# Patient Record
Sex: Female | Born: 2000 | Race: White | Hispanic: No | Marital: Single | Smoking: Never smoker
Health system: Southern US, Community
[De-identification: ages and names within clinical notes are randomized; demographics above are authoritative.]

## PROBLEM LIST (undated history)

## (undated) DIAGNOSIS — F419 Anxiety disorder, unspecified: Secondary | ICD-10-CM

## (undated) HISTORY — PX: WISDOM TOOTH EXTRACTION: SHX21

## (undated) HISTORY — PX: MOUTH SURGERY: SHX715

## (undated) HISTORY — DX: Anxiety disorder, unspecified: F41.9

---
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
## 2020-01-04 ENCOUNTER — Ambulatory Visit: Payer: BC Managed Care – PPO | Admitting: Dermatology

## 2020-02-26 ENCOUNTER — Ambulatory Visit: Payer: BC Managed Care – PPO | Admitting: Dermatology

## 2020-03-20 ENCOUNTER — Ambulatory Visit: Payer: BC Managed Care – PPO | Admitting: Dermatology

## 2020-03-20 ENCOUNTER — Other Ambulatory Visit: Payer: Self-pay

## 2020-03-20 DIAGNOSIS — L700 Acne vulgaris: Secondary | ICD-10-CM

## 2020-03-20 MED ORDER — DOXYCYCLINE HYCLATE 20 MG PO TABS: 20.0000 mg | ORAL_TABLET | Freq: Two times a day (BID) | ORAL | 0 refills | Status: AC

## 2020-03-20 MED ORDER — SPIRONOLACTONE 50 MG PO TABS: 75.0000 mg | ORAL_TABLET | Freq: Every day | ORAL | 2 refills | Status: DC

## 2020-03-20 NOTE — Progress Notes (Signed)
   Follow-Up Visit   Subjective  Morgan Simmons is a 19 y.o. female who presents for the following: Acne follow-up. She reports some flaring especially around menses.  The following portions of the chart were reviewed this encounter and updated as appropriate:  Tobacco  Allergies  Meds  Problems  Med Hx  Surg Hx  Fam Hx      Review of Systems: No other skin or systemic complaints except as noted in HPI or Assessment and Plan.   Objective  Well appearing patient in no apparent distress; mood and affect are within normal limits.  A focused examination was performed including face, neck, chest and back. Relevant physical exam findings are noted in the Assessment and Plan.  Objective  Head - Anterior (Face): Scattered erythematous papules Closed comedones at the cheeks Scattered inflammatory papules chest and back and neck  Assessment & Plan  Acne vulgaris Head - Anterior (Face)  Chronic condition with expected duration over one year. There is no cure, only control. Condition is bothersome to patient. Not currently at goal.  Increase Spironolactone to 75 mg QD. (1.5 tables daily). Decrease to 50 mg if you have any lightheadedness.  She did not previously tolerate 100 mg qhs.  Continue Tretinoin 0.025% cream QHS. Continue Clindamycin lotion QAM.    BP in office 131/83  Spironolactone can cause increased urination and cause blood pressure to decrease. Please watch for signs of lightheadedness and be cautious when changing position. It can sometimes cause breast tenderness or an irregular period in premenopausal women. It can also increase potassium. The increase in potassium usually is not a concern unless you are taking other medicines that also increase potassium, so please be sure your doctor knows all of the other medications you are taking. This medication should not be taken by pregnant women.  This medicine should also not be taken together with sulfa drugs like  Bactrim (trimethoprim/sulfamethexazole).   Take Doxycycline 20 MG BID. Take with food.  Doxycycline should be taken with food to prevent nausea. Do not lay down for 30 minutes after taking. Be cautious with sun exposure and use good sun protection while on this medication. Pregnant women should not take this medication.     doxycycline (PERIOSTAT) 20 MG tablet - Head - Anterior (Face)  spironolactone (ALDACTONE) 50 MG tablet - Head - Anterior (Face)  Other Related Medications clindamycin (CLEOCIN-T) 1 % lotion tretinoin (RETIN-A) 0.025 % cream Dapsone (ACZONE) 7.5 % GEL spironolactone (ALDACTONE) 50 MG tablet  Return in about 6 weeks (around 05/01/2020) for acne f/u.   I, Epifania Gore, CMA, scribed for Sandi Mealy, MD.  Documentation: I have reviewed the above documentation for accuracy and completeness, and I agree with the above.  Darden Dates, MD

## 2020-03-20 NOTE — Patient Instructions (Addendum)
Consider adding Viviscal supplement for hair growth if pediatrician does not have any concerns about interactions with Zoloft  Continue tretinoin at night Continue clindamycin in the morning  Increase spironolactone to 1.5 tablets each day (75 mg total). Decrease dose if you have any lightheadedness.  Start doxycycline 20 mg tablet twice a day with food.   Doxycycline should be taken with food to prevent nausea. Do not lay down for 30 minutes after taking. Be cautious with sun exposure and use good sun protection while on this medication. Pregnant women should not take this medication.   Spironolactone can cause increased urination and cause blood pressure to decrease. Please watch for signs of lightheadedness and be cautious when changing position. It can sometimes cause breast tenderness or an irregular period in premenopausal women. It can also increase potassium. The increase in potassium usually is not a concern unless you are taking other medicines that also increase potassium, so please be sure your doctor knows all of the other medications you are taking. This medication should not be taken by pregnant women.  This medicine should also not be taken together with sulfa drugs like Bactrim (trimethoprim/sulfamethexazole).

## 2020-03-28 ENCOUNTER — Encounter: Payer: Self-pay | Admitting: Dermatology

## 2020-04-10 ENCOUNTER — Other Ambulatory Visit: Payer: Self-pay

## 2020-04-10 ENCOUNTER — Ambulatory Visit: Payer: BC Managed Care – PPO | Admitting: Dermatology

## 2020-04-10 ENCOUNTER — Encounter: Payer: Self-pay | Admitting: Dermatology

## 2020-04-10 DIAGNOSIS — L700 Acne vulgaris: Secondary | ICD-10-CM

## 2020-04-10 MED ORDER — TRETINOIN 0.025 % EX CREA: TOPICAL_CREAM | Freq: Every evening | CUTANEOUS | 11 refills | Status: AC

## 2020-04-10 MED ORDER — DOXYCYCLINE MONOHYDRATE 100 MG PO CAPS: 100.0000 mg | ORAL_CAPSULE | Freq: Two times a day (BID) | ORAL | 2 refills | Status: DC

## 2020-04-10 NOTE — Progress Notes (Signed)
   Follow-Up Visit   Subjective  Morgan Simmons. Morgan Simmons is a 20 y.o. female who presents for the following: Acne (Patient is here today for 3 week follow up on acne. Patient states she is having new breakouts on cheek. ).  She is currently taking Spironolactone 75 mg, tretinoin 0.025 % cream, clindamycin lotion 1% and doxycycline 20 mg. She reports no other concerns at this time.   The following portions of the chart were reviewed this encounter and updated as appropriate:  Tobacco  Allergies  Meds  Problems  Med Hx  Surg Hx  Fam Hx      Objective  Well appearing patient in no apparent distress; mood and affect are within normal limits.  A focused examination was performed including face, neck, chest and back. Relevant physical exam findings are noted in the Assessment and Plan.  Objective  cheeks, chin, chest, back, face: Rare inflammatory papules at chest Trace open comedones and many small inflammatory papules at face, concentrated at right cheek Scattered inflammatory papules at back and neck  Assessment & Plan  Acne vulgaris cheeks, chin, chest, back, face  Chronic condition with duration over one year. Condition is bothersome to patient. Currently flared despite treatment.  Acne with possible rosacea   BP 118/63  Continue spironolactone 75 mg daily (she did not tolerate higher dose due to decrease BP). D/c doxycycline 20 mg. Take  Doxycycline (monodox) 100 MG twice a day. Take with food.  Continue clindamycin solution each morning Continue tretinoin 0.025% cream nightly  If not responding well may consider isotretinoin. If responds well but cannot come off of doxycycline without flare, will consider topical minoxidil foam vs other rosacea topicals (metronidazole cream vs skin medicinals metronidazole/ivermectin/azelaic acid cream).   Spironolactone can cause increased urination and cause blood pressure to decrease. Please watch for signs of lightheadedness and be  cautious when changing position. It can sometimes cause breast tenderness or an irregular period in premenopausal women. It can also increase potassium. The increase in potassium usually is not a concern unless you are taking other medicines that also increase potassium, so please be sure your doctor knows all of the other medications you are taking. This medication should not be taken by pregnant women.  This medicine should also not be taken together with sulfa drugs like Bactrim (trimethoprim/sulfamethexazole).   Doxycycline should be taken with food to prevent nausea. Do not lay down for 30 minutes after taking. Be cautious with sun exposure and use good sun protection while on this medication. Pregnant women should not take this medication.     Ordered Medications: doxycycline (MONODOX) 100 MG capsule  Reordered Medications tretinoin (RETIN-A) 0.025 % cream  Other Related Medications clindamycin (CLEOCIN-T) 1 % lotion Dapsone (ACZONE) 7.5 % GEL spironolactone (ALDACTONE) 50 MG tablet doxycycline (PERIOSTAT) 20 MG tablet spironolactone (ALDACTONE) 50 MG tablet  Return in about 2 months (around 06/08/2020) for virtual on acne.  I, Asher Muir, CMA, am acting as scribe for Darden Dates, MD.  Documentation: I have reviewed the above documentation for accuracy and completeness, and I agree with the above.  Darden Dates, MD

## 2020-04-10 NOTE — Patient Instructions (Addendum)
Spironolactone can cause increased urination and cause blood pressure to decrease. Please watch for signs of lightheadedness and be cautious when changing position. It can sometimes cause breast tenderness or an irregular period in premenopausal women. It can also increase potassium. The increase in potassium usually is not a concern unless you are taking other medicines that also increase potassium, so please be sure your doctor knows all of the other medications you are taking. This medication should not be taken by pregnant women.  This medicine should also not be taken together with sulfa drugs like Bactrim (trimethoprim/sulfamethexazole).   Take Doxycycline 100 MG BID. Take with food.  Doxycycline should be taken with food to prevent nausea. Do not lay down for 30 minutes after taking. Be cautious with sun exposure and use good sun protection while on this medication. Pregnant women should not take this medication.

## 2020-06-05 ENCOUNTER — Telehealth: Payer: BC Managed Care – PPO | Admitting: Dermatology

## 2020-06-05 ENCOUNTER — Other Ambulatory Visit: Payer: Self-pay

## 2020-06-14 ENCOUNTER — Other Ambulatory Visit: Payer: Self-pay | Admitting: Dermatology

## 2020-06-14 DIAGNOSIS — L700 Acne vulgaris: Secondary | ICD-10-CM

## 2020-06-28 ENCOUNTER — Other Ambulatory Visit: Payer: Self-pay | Admitting: Dermatology

## 2020-06-28 DIAGNOSIS — L700 Acne vulgaris: Secondary | ICD-10-CM

## 2020-07-31 ENCOUNTER — Other Ambulatory Visit: Payer: Self-pay | Admitting: Dermatology

## 2020-07-31 ENCOUNTER — Encounter: Payer: Self-pay | Admitting: Dermatology

## 2020-07-31 ENCOUNTER — Telehealth (INDEPENDENT_AMBULATORY_CARE_PROVIDER_SITE_OTHER): Payer: BC Managed Care – PPO | Admitting: Dermatology

## 2020-07-31 DIAGNOSIS — L700 Acne vulgaris: Secondary | ICD-10-CM | POA: Diagnosis not present

## 2020-07-31 DIAGNOSIS — L905 Scar conditions and fibrosis of skin: Secondary | ICD-10-CM | POA: Diagnosis not present

## 2020-07-31 MED ORDER — SPIRONOLACTONE 25 MG PO TABS: ORAL_TABLET | ORAL | 1 refills | Status: DC

## 2020-07-31 MED ORDER — GENTAMICIN SULFATE 0.1 % EX CREA: 1.0000 "application " | TOPICAL_CREAM | Freq: Two times a day (BID) | CUTANEOUS | 1 refills | Status: AC

## 2020-07-31 MED ORDER — AZITHROMYCIN 500 MG PO TABS: 500.0000 mg | ORAL_TABLET | ORAL | 0 refills | Status: AC

## 2020-07-31 NOTE — Progress Notes (Signed)
Follow-Up Visit   Subjective  Morgan Simmons is a 20 y.o. female who presents for the following: Acne (Patient presents for video visit to follow up on acne. Patient is in [place]. She is currently taking spironolactone 75mg  daily and doxycycline 100mg  BID, using clindamycin QAM and tretinoin 0.025% QHS. Patient advises she is tolerating medications well but cheeks are flared. ).  Virtual Visit via Video Note  I connected with . [place] on 07/31/20 at  4:30 PM EDT by a video enabled telemedicine application and verified that I am speaking with the correct person using two identifiers.  Location: Patient: Morgan Simmons Provider: [facility]  I discussed the limitations of evaluation and management by telemedicine and the availability of in person appointments. The patient expressed understanding and agreed to proceed.   I discussed the assessment and treatment plan with the patient. The patient was provided an opportunity to ask questions and all were answered. The patient agreed with the plan and demonstrated an understanding of the instructions.   The patient was advised to call back or seek an in-person evaluation if the symptoms worsen or if the condition fails to improve as anticipated.    The following portions of the chart were reviewed this encounter and updated as appropriate:   Tobacco  Allergies  Meds  Problems  Med Hx  Surg Hx  Fam Hx      Review of Systems:  No other skin or systemic complaints except as noted in HPI or Assessment and Plan.  Objective  Well appearing patient in no apparent distress; mood and affect are within normal limits.  A focused examination was performed including face. Relevant physical exam findings are noted in the Assessment and Plan.  Objective  Head - Anterior (Face): Many inflammatory papules and pustules, 1+ open comedone, scattered scars (exam limited by video quality)   Assessment & Plan  Acne  vulgaris Head - Anterior (Face)  Chronic condition with duration over one year. Condition is bothersome to patient. Currently flared.  Severe with new scarring  Unable to culture due to video visit  D/C doxycycline Continue clindamycin QAM Continue tretinoin 0.025% QHS Continue spironolactone 75mg  1 PO QD Start gentamicin BID Start azithromycin 500mg  3 times weekly  Plan isotretinoin start.   Will mail patient iPledge paperwork and lab order for urine pregnancy . Once we receive signed paperwork we will call patient to let her know to go and have urine pregnancy done so we can get her registered. Patient is on birth control and will use female condoms for secondary.   Reviewed potential side effects of isotretinoin including xerosis, cheilitis, hepatitis, hyperlipidemia, and severe birth defects if taken by a pregnant woman. Reviewed reports of suicidal ideation in those with a history of depression while taking isotretinoin and reports of diagnosis of inflammatory bowl disease while taking isotretinoin as well as the lack of evidence for a causal relationship between isotretinoin, depression and IBD. Patient advised to reach out with any questions or concerns. Patient advised not to share pills or donate blood while on treatment or for one month after completing treatment.   Other Related Procedures Pregnancy, urine  Ordered Medications: azithromycin (ZITHROMAX) 500 MG tablet gentamicin cream (GARAMYCIN) 0.1 % spironolactone (ALDACTONE) 25 MG tablet  Other Related Medications clindamycin (CLEOCIN-T) 1 % lotion tretinoin (RETIN-A) 0.025 % cream  32 minutes spent in care of this patient including face to face video visit and coordination of care  [place] address [address] [facility] [id number] [place] [address]  Return for will schedule once iPledge paperwork received .  Anise Salvo, RMA, am acting as scribe for Darden Dates, MD .  Documentation: I have  reviewed the above documentation for accuracy and completeness, and I agree with the above.  Darden Dates, MD

## 2020-07-31 NOTE — Patient Instructions (Signed)
Reviewed potential side effects of isotretinoin including xerosis, cheilitis, hepatitis, hyperlipidemia, and severe birth defects if taken by a pregnant woman. Reviewed reports of suicidal ideation in those with a history of depression while taking isotretinoin and reports of diagnosis of inflammatory bowl disease while taking isotretinoin as well as the lack of evidence for a causal relationship between isotretinoin, depression and IBD. Patient advised to reach out with any questions or concerns. Patient advised not to share pills or donate blood while on treatment or for one month after completing treatment.   

## 2020-08-02 ENCOUNTER — Encounter: Payer: Self-pay | Admitting: Dermatology
